# Patient Record
Sex: Male | Born: 1990 | Race: White | Hispanic: No | Marital: Single | Smoking: Current every day smoker
Health system: Southern US, Community
[De-identification: ages and names within clinical notes are randomized; demographics above are authoritative.]

---
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
## 2012-11-24 ENCOUNTER — Encounter (HOSPITAL_COMMUNITY): Payer: Self-pay | Admitting: Emergency Medicine

## 2012-11-24 ENCOUNTER — Emergency Department (HOSPITAL_COMMUNITY)
Admission: EM | Admit: 2012-11-24 | Discharge: 2012-11-25 | Disposition: A | Discharge status: Home / Self Care | Payer: Managed Care, Other (non HMO) | Attending: Emergency Medicine | Admitting: Emergency Medicine

## 2012-11-24 ENCOUNTER — Emergency Department (HOSPITAL_COMMUNITY): Payer: Managed Care, Other (non HMO)

## 2012-11-24 DIAGNOSIS — Y9239 Other specified sports and athletic area as the place of occurrence of the external cause: Secondary | ICD-10-CM | POA: Insufficient documentation

## 2012-11-24 DIAGNOSIS — J45909 Unspecified asthma, uncomplicated: Secondary | ICD-10-CM | POA: Insufficient documentation

## 2012-11-24 DIAGNOSIS — F17200 Nicotine dependence, unspecified, uncomplicated: Secondary | ICD-10-CM | POA: Insufficient documentation

## 2012-11-24 DIAGNOSIS — S61409A Unspecified open wound of unspecified hand, initial encounter: Secondary | ICD-10-CM | POA: Insufficient documentation

## 2012-11-24 DIAGNOSIS — S61412A Laceration without foreign body of left hand, initial encounter: Secondary | ICD-10-CM

## 2012-11-24 DIAGNOSIS — Z23 Encounter for immunization: Secondary | ICD-10-CM | POA: Insufficient documentation

## 2012-11-24 DIAGNOSIS — Y9351 Activity, roller skating (inline) and skateboarding: Secondary | ICD-10-CM | POA: Insufficient documentation

## 2012-11-24 DIAGNOSIS — W268XXA Contact with other sharp object(s), not elsewhere classified, initial encounter: Secondary | ICD-10-CM | POA: Insufficient documentation

## 2012-11-24 MED ORDER — TETANUS-DIPHTH-ACELL PERTUSSIS 5-2.5-18.5 LF-MCG/0.5 IM SUSP
0.5000 mL | Freq: Once | INTRAMUSCULAR | Status: AC
  Administered 2012-11-24: 0.5 mL via INTRAMUSCULAR
  Filled 2012-11-24: qty 0.5

## 2012-11-24 NOTE — ED Provider Notes (Signed)
CSN: [id number]     Arrival date & time 11/24/12  2042 History  This chart was scribed for Felicie Morn, NP, working with Juliet Rude. Rubin Payor, MD, by Allene Dillon, ED Scribe. This patient was seen in room TR06C/TR06C and the patient's care was started at 9:24 PM.   Chief Complaint  Patient presents with  . Laceration    Patient is a 22 y.o. male presenting with skin laceration. The history is provided by the patient. No language interpreter was used.  Laceration Location:  Hand Hand laceration location:  L hand Length (cm):  2 Depth:  Cutaneous Bleeding: controlled   Time since incident:  2 hours Laceration mechanism:  Unable to specify Relieved by:  None tried Worsened by:  Movement Ineffective treatments:  None tried Tetanus status:  Unknown  HPI Comments: Sergio Green is a 22 y.o. male who presents to the Emergency Department complaining of a laceration to his left hand onset earlier today. Bleeding is controlled. Pt states that he was skateboarding, and "somehow injured his hand". He denies weakness, numbness or tingling to the hand. He states that he is unsure if his Tetanus vaccinations may not be up to date. He denies fever, chills, nausea, emesis or any other symptoms.   Past Medical History  Diagnosis Date  . Asthma    History reviewed. No pertinent past surgical history. No family history on file. History  Substance Use Topics  . Smoking status: Current Every Day Smoker -- 0.30 packs/day for 1 years    Types: Cigarettes  . Smokeless tobacco: Never Used  . Alcohol Use: No    Review of Systems  Constitutional: Negative for fever and chills.  Gastrointestinal: Negative for nausea and vomiting.  Skin: Positive for wound (left hand laceration).  All other systems reviewed and are negative.   Allergies  Shellfish allergy  Home Medications   Current Outpatient Rx  Name  Route  Sig  Dispense  Refill  . acetaminophen (TYLENOL) 500 MG tablet   Oral  Take 1,000 mg by mouth every 6 (six) hours as needed for pain.          Triage Vitals: BP 130/80  Pulse 92  Temp(Src) 98.7 F (37.1 C) (Oral)  Resp 16  SpO2 98%  Physical Exam  Nursing note and vitals reviewed. Constitutional: He is oriented to person, place, and time. He appears well-developed and well-nourished.  HENT:  Head: Normocephalic.  Eyes: EOM are normal.  Neck: Normal range of motion.  Cardiovascular: Normal rate and regular rhythm.   Pulmonary/Chest: Effort normal and breath sounds normal. No respiratory distress.  Abdominal: Soft. He exhibits no distension.  Musculoskeletal: Normal range of motion.  Neurological: He is alert and oriented to person, place, and time.  Skin: Skin is warm and dry.  Laceration overlying the MP joints of the 4th and 5th fingers of the left hand. Full ROM of wrist, hand and all fingers.  Psychiatric: He has a normal mood and affect.    ED Course  Procedures (including critical care time)  LACERATION REPAIR Performed by: Jimmye Norman Authorized by: Jimmye Norman Consent: Verbal consent obtained. Risks and benefits: risks, benefits and alternatives were discussed Consent given by: patient Patient identity confirmed: provided demographic data Prepped and Draped in normal sterile fashion Wound explored  Laceration Location: MP joint, little finger, left hand  Laceration Length: 2 cm  No Foreign Bodies seen or palpated  Anesthesia: local infiltration  Local anesthetic: lidocaine 2%  Anesthetic total: 3 ml  Irrigation method: syringe  Amount of cleaning: extensive  Skin closure: 4.0 prolene  Number of sutures: 4  Technique: simple interrupted  Patient tolerance: Patient tolerated the procedure well with no immediate complications.  DIAGNOSTIC STUDIES: Oxygen Saturation is 98% on RA, normal by my interpretation.    COORDINATION OF CARE: 9:28 PM- Pt advised of plan for laceration repair and pt agrees.  Labs  Review Labs Reviewed - No data to display Imaging Review No results found.  MDM  Laceration to MP joint left little finger.  Radiology findings concerning for occult fracture proximal phalanx.  Unable to contact hand surgeon via answering service after multiple attempts--discussed with Dr. Jerene Canny suture and cover with antibiotic, and have patient contact the office in the morning to schedule follow-up.  Return precautions discussed.   I personally performed the services described in this documentation, which was scribed in my presence. The recorded information has been reviewed and is accurate.    Jimmye Norman, NP 11/25/12 [phone]

## 2012-11-24 NOTE — ED Notes (Signed)
Pt presented to ED with left hand laceration.As per pt he was in skating board and as he bent over he felt a different something and  saw the bleed from his left hand.Pt not sure how it happened.

## 2012-11-25 MED ORDER — CEPHALEXIN 500 MG PO CAPS: 500.0000 mg | ORAL_CAPSULE | Freq: Four times a day (QID) | ORAL | Status: AC

## 2012-11-25 NOTE — ED Notes (Signed)
Pts mother complained that the floor was dirty .Floor was cleaned by house keeping before pt was brought to the room.Pt and his mother was reassured that all was clean and the floor has been cleaned.

## 2012-11-25 NOTE — ED Notes (Signed)
Pt discharged.Vital signs stable and GCS 15 

## 2012-11-26 NOTE — ED Provider Notes (Signed)
Medical screening examination/treatment/procedure(s) were performed by non-physician practitioner and as supervising physician I was immediately available for consultation/collaboration.  Keely Drennan R. Mikale Silversmith, MD 11/26/12 1612 

## 2012-12-01 ENCOUNTER — Inpatient Hospital Stay: Payer: Self-pay | Admitting: Surgery

## 2012-12-01 LAB — URINALYSIS, COMPLETE
Bacteria: NONE SEEN
Bilirubin,UR: NEGATIVE
Blood: NEGATIVE
Glucose,UR: NEGATIVE mg/dL (ref 0–75)
Leukocyte Esterase: NEGATIVE
Ph: 6 (ref 4.5–8.0)
RBC,UR: NONE SEEN /HPF (ref 0–5)
Specific Gravity: 1.018 (ref 1.003–1.030)
Squamous Epithelial: NONE SEEN
WBC UR: 1 /HPF (ref 0–5)

## 2012-12-01 LAB — COMPREHENSIVE METABOLIC PANEL
Albumin: 4.1 g/dL (ref 3.4–5.0)
BUN: 13 mg/dL (ref 7–18)
Bilirubin,Total: 1.2 mg/dL — ABNORMAL HIGH (ref 0.2–1.0)
Calcium, Total: 9 mg/dL (ref 8.5–10.1)
Chloride: 99 mmol/L (ref 98–107)
Co2: 30 mmol/L (ref 21–32)
EGFR (Non-African Amer.): >60
Glucose: 117 mg/dL — ABNORMAL HIGH (ref 65–99)
Osmolality: 268 (ref 275–301)
Potassium: 4.2 mmol/L (ref 3.5–5.1)
SGOT(AST): 25 U/L (ref 15–37)
Total Protein: 7.6 g/dL (ref 6.4–8.2)

## 2012-12-01 LAB — CBC
HCT: 39.8 % — ABNORMAL LOW (ref 40.0–52.0)
MCV: 90 fL (ref 80–100)
RBC: 4.43 10*6/uL (ref 4.40–5.90)
RDW: 12.7 % (ref 11.5–14.5)
WBC: 12.5 10*3/uL — ABNORMAL HIGH (ref 3.8–10.6)

## 2012-12-01 LAB — LIPASE, BLOOD: Lipase: 45 U/L — ABNORMAL LOW (ref 73–393)

## 2012-12-02 LAB — CBC WITH DIFFERENTIAL/PLATELET
Basophil #: 0 10*3/uL (ref 0.0–0.1)
Basophil %: 0.6 %
Eosinophil #: 0.1 10*3/uL (ref 0.0–0.7)
Eosinophil #: 0.1 10*3/uL (ref 0.0–0.7)
Eosinophil %: 1.9 %
Eosinophil %: 1.7 %
HCT: 34.8 % — ABNORMAL LOW (ref 40.0–52.0)
HCT: 34.1 % — ABNORMAL LOW (ref 40.0–52.0)
HGB: 12.4 g/dL — ABNORMAL LOW (ref 13.0–18.0)
HGB: 12.1 g/dL — ABNORMAL LOW (ref 13.0–18.0)
Lymphocyte #: 1.7 10*3/uL (ref 1.0–3.6)
Lymphocyte #: 1.5 10*3/uL (ref 1.0–3.6)
Lymphocyte %: 23.8 %
Lymphocyte %: 25.5 %
MCH: 31.7 pg (ref 26.0–34.0)
MCHC: 35.6 g/dL (ref 32.0–36.0)
MCHC: 35.5 g/dL (ref 32.0–36.0)
MCV: 90 fL (ref 80–100)
Monocyte #: 0.8 x10 3/mm (ref 0.2–1.0)
Monocyte #: 0.7 x10 3/mm (ref 0.2–1.0)
Monocyte %: 11.4 %
Neutrophil #: 4.5 10*3/uL (ref 1.4–6.5)
Neutrophil %: 63.2 %
RDW: 12.4 % (ref 11.5–14.5)
WBC: 6.1 10*3/uL (ref 3.8–10.6)

## 2012-12-02 LAB — AMYLASE: Amylase: 21 U/L — ABNORMAL LOW (ref 25–115)

## 2012-12-02 LAB — COMPREHENSIVE METABOLIC PANEL
Albumin: 3.6 g/dL (ref 3.4–5.0)
Alkaline Phosphatase: 80 U/L (ref 50–136)
BUN: 11 mg/dL (ref 7–18)
Chloride: 102 mmol/L (ref 98–107)
Glucose: 97 mg/dL (ref 65–99)
Potassium: 3.9 mmol/L (ref 3.5–5.1)
SGOT(AST): 23 U/L (ref 15–37)
Total Protein: 6.8 g/dL (ref 6.4–8.2)

## 2012-12-02 LAB — PROTIME-INR: INR: 1.1

## 2012-12-02 LAB — LIPASE, BLOOD: Lipase: 43 U/L — ABNORMAL LOW (ref 73–393)

## 2012-12-03 LAB — COMPREHENSIVE METABOLIC PANEL
Alkaline Phosphatase: 76 U/L (ref 50–136)
BUN: 6 mg/dL — ABNORMAL LOW (ref 7–18)
Co2: 28 mmol/L (ref 21–32)
Glucose: 96 mg/dL (ref 65–99)
Osmolality: 273 (ref 275–301)
Sodium: 138 mmol/L (ref 136–145)
Total Protein: 6.4 g/dL (ref 6.4–8.2)

## 2012-12-03 LAB — CBC WITH DIFFERENTIAL/PLATELET
Basophil #: 0 10*3/uL (ref 0.0–0.1)
Basophil %: 0.7 %
Eosinophil %: 3.4 %
HGB: 11.8 g/dL — ABNORMAL LOW (ref 13.0–18.0)
Lymphocyte %: 26.5 %
MCH: 32 pg (ref 26.0–34.0)
MCHC: 35.5 g/dL (ref 32.0–36.0)
MCV: 90 fL (ref 80–100)
Monocyte %: 12.8 %
Neutrophil #: 3 10*3/uL (ref 1.4–6.5)
Neutrophil %: 56.6 %
RBC: 3.67 10*6/uL — ABNORMAL LOW (ref 4.40–5.90)

## 2012-12-07 ENCOUNTER — Ambulatory Visit: Payer: Self-pay | Admitting: Surgery

## 2012-12-07 LAB — CBC WITH DIFFERENTIAL/PLATELET
Basophil #: 0 10*3/uL (ref 0.0–0.1)
Basophil %: 0.4 %
Eosinophil #: 0.3 10*3/uL (ref 0.0–0.7)
Eosinophil %: 3.9 %
HGB: 14 g/dL (ref 13.0–18.0)
Monocyte %: 12.1 %
Platelet: 242 10*3/uL (ref 150–440)

## 2013-01-05 ENCOUNTER — Ambulatory Visit: Payer: Self-pay | Admitting: Surgery

## 2014-03-30 IMAGING — CT CT ABD-PELV W/ CM
1 of 3 series · 15 of 32 positions shown, 19 images · IV contrast (isovue)
Comparison: 12/01/2012

CLINICAL DATA: Status post fall. Ruptured spleen.

EXAM:
CT ABDOMEN AND PELVIS WITH CONTRAST
TECHNIQUE: Multidetector CT imaging of the abdomen and pelvis was performed
using the standard protocol following bolus administration of
intravenous contrast.
CONTRAST:  100 cc of Isovue 300

[Series 3: routine abd pel thins · axial · 0.66mm/px · z∈[-939,-546]mm · 15 of 359 slices shown, 19 images]
[im 16/359  soft-tissue]
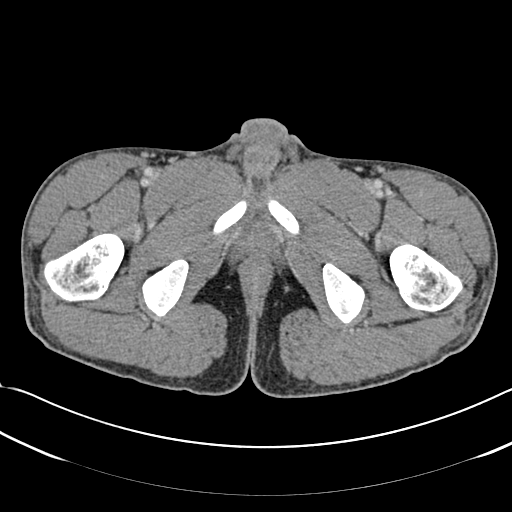
[im 16/359  bone]
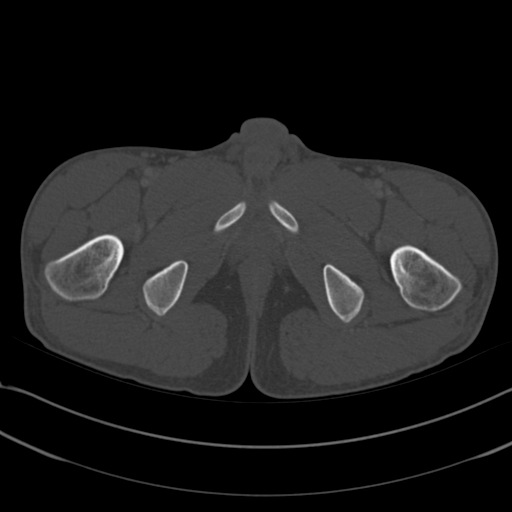
[im 47/359  soft-tissue]
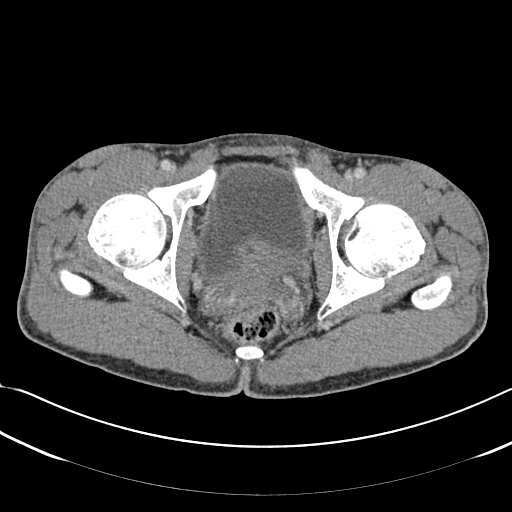
[im 78/359  soft-tissue]
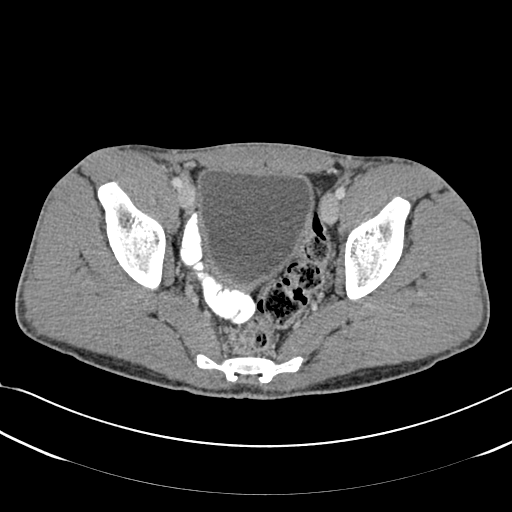
[im 94/359  soft-tissue]
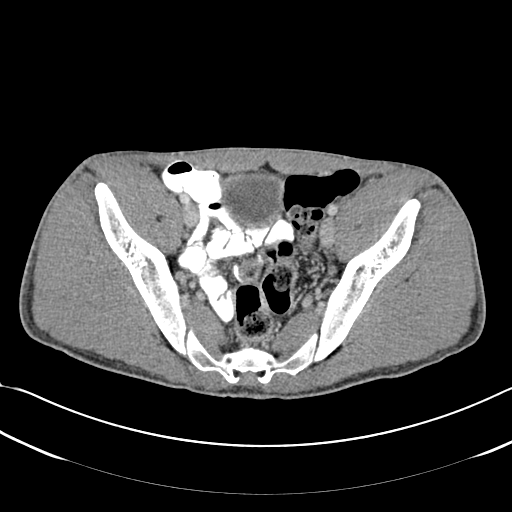
[im 125/359  soft-tissue]
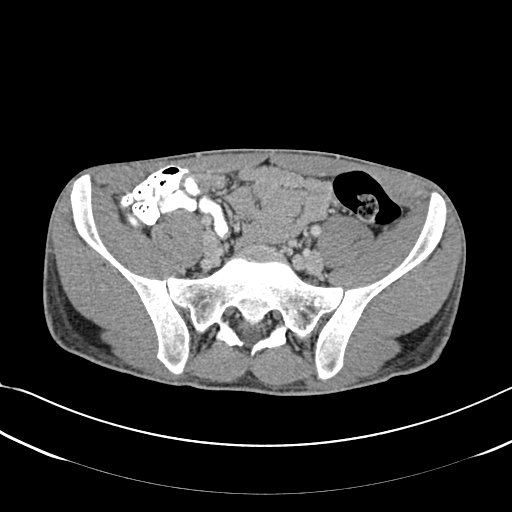
[im 156/359  soft-tissue]
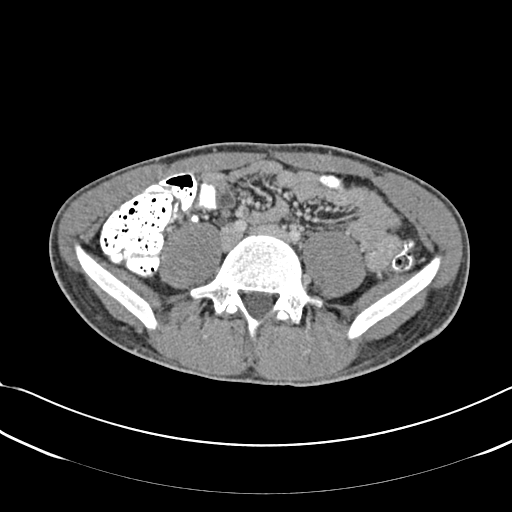
[im 187/359  soft-tissue]
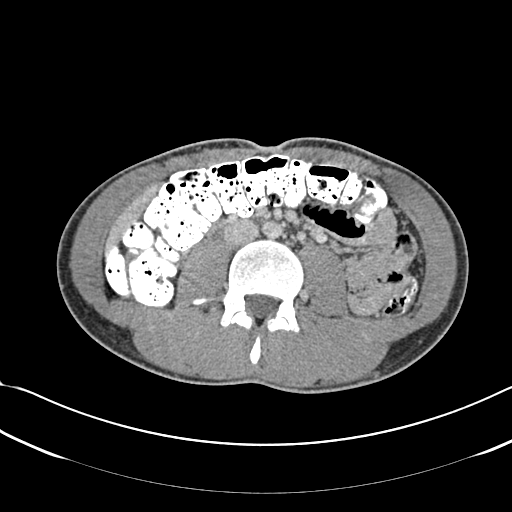
[im 203/359  soft-tissue]
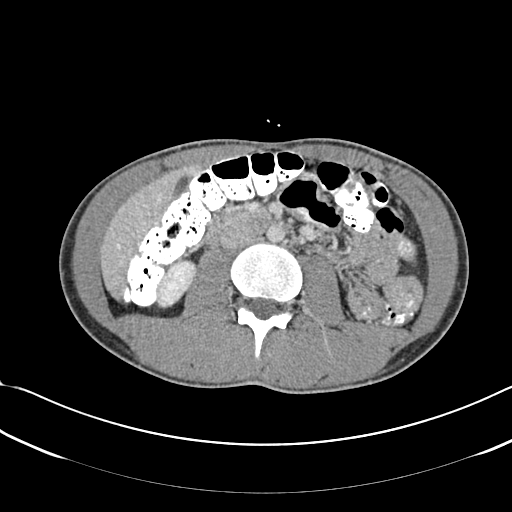
[im 234/359  soft-tissue]
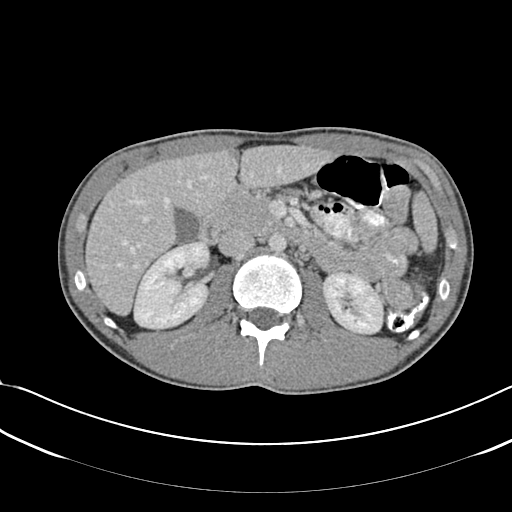
[im 234/359  bone]
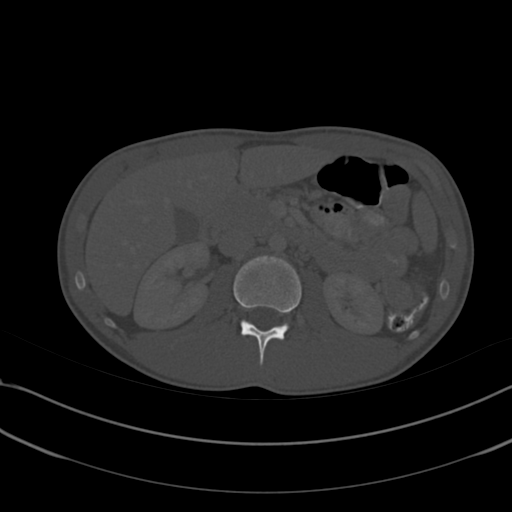
[im 265/359  soft-tissue]
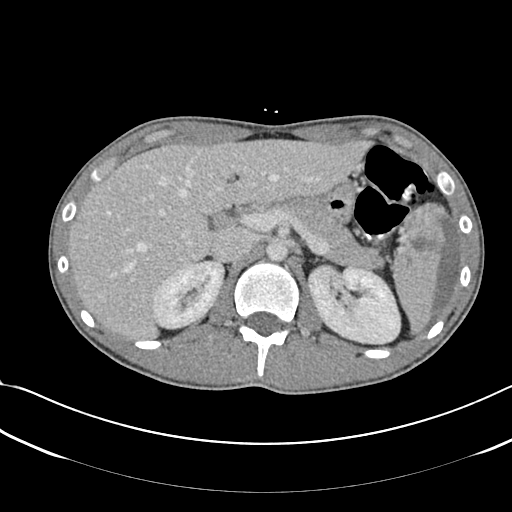
[im 281/359  soft-tissue]
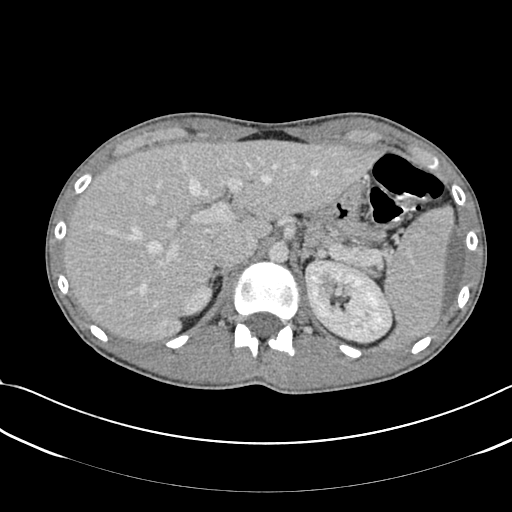
[im 296/359  lung]
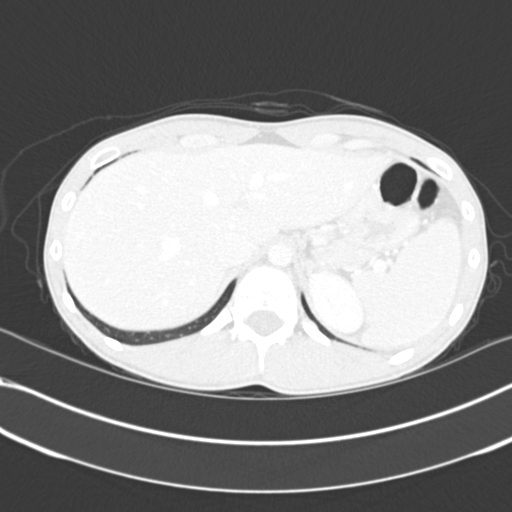
[im 312/359  soft-tissue]
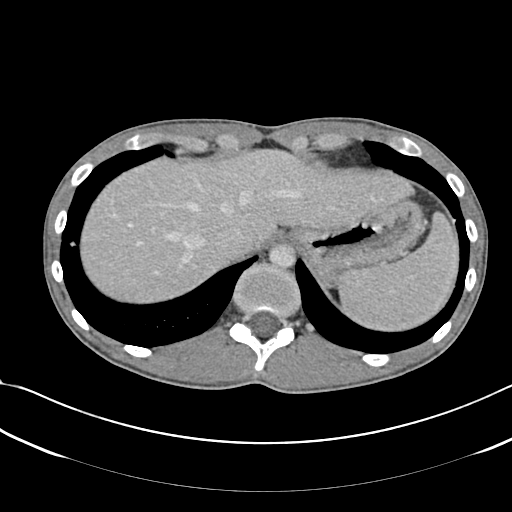
[im 312/359  lung]
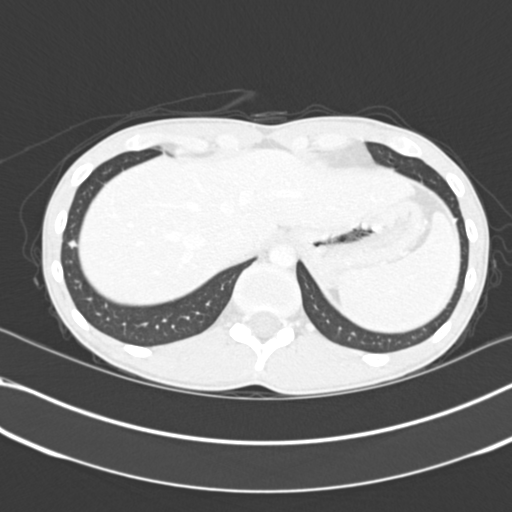
[im 327/359  lung]
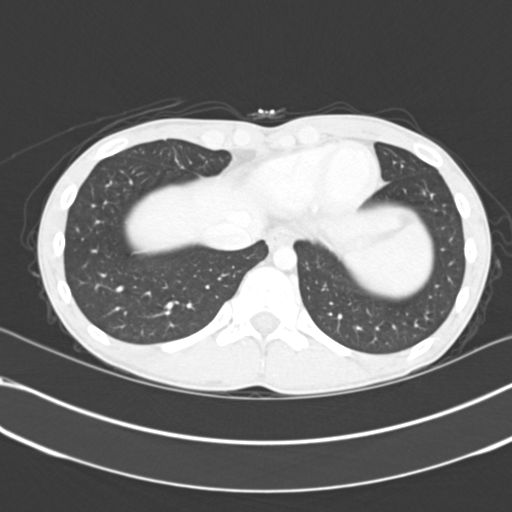
[im 343/359  soft-tissue]
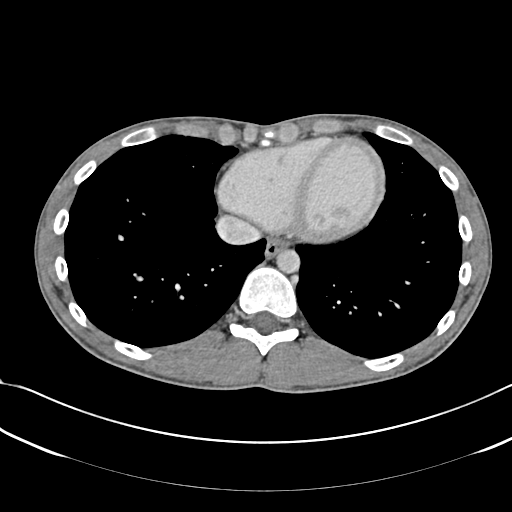
[im 343/359  lung]
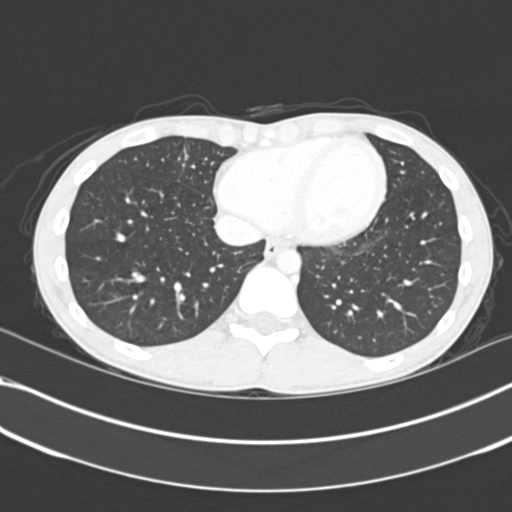

[15 of 32 positions shown; findings below may reference images not displayed]

FINDINGS: The lung bases are clear. No pericardial or pleural effusion
identified no focal liver abnormality identified. The gallbladder
appears within normal limits. No biliary dilatation. Normal
appearance of the pancreas. Laceration involving the inferior aspect
of the spleen is again noted. The laceration measures approximately
2 x 2 cm which is stable to decreased in size from previous exam.
Small low attenuation subcapsular fluid collection is decreased in
volume from previous exam compatible with resolving hematoma.

The adrenal glands are normal. The kidneys are both on unremarkable.
The urinary bladder is negative. There is a trace amount of
low-attenuation fluid within the dependent portion of the pelvis.

Normal caliber of the abdominal aorta. There is no aneurysm. No
upper abdominal adenopathy identified. There is no pelvic or
inguinal adenopathy.

The stomach appears normal. The small bowel loops have a normal
course and caliber and there is no obstruction. The appendix is
visualized and appears normal. Normal appearance of the colon.

Review of the visualized bony structures shows no acute bone
abnormality.
IMPRESSION: 1. Stable and improved appearance of laceration involving the
inferior aspect of the spleen.

2. Improved appearance of subcapsular splenic fluid collection
compatible with resolving hematoma

3. Decrease in hemoperitoneum with only a trace amount of
low-attenuation fluid within the dependent portion of the pelvis.

## 2014-06-24 NOTE — H&P (Signed)
History of Present Illness 22 yowm who incurred a skateboarding injury one week ago, requiring suture repair of a left hand laceration. No LOC or pain other than his hand then. Did well until last night at approximately 500 PM when he experienced stomach pain while visiting a friend. He felt hungry, was able to drive home, and then late last evening he became dizzy. He was able to sleep OK last night and then after a nap this AM he sought medical attention. He has not had any further dizziness. He denies fever, chills, or any other Sx.   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   + Tobacco Current (within 1 year)   Place of [place]   Review of Systems:  Fever/Chills No   Cough No   Sputum No   Abdominal Pain Yes   Diarrhea No   Constipation No   Nausea/Vomiting No   SOB/DOE No   Chest Pain No   Dysuria No   Tolerating PT Yes   Tolerating Diet Yes   Medications/Allergies Reviewed Medications/Allergies reviewed   Physical Exam:  GEN well developed, well nourished, no acute distress, thin   HEENT pink conjunctivae, PERRL, hearing intact to voice, moist oral mucosa, Oropharynx clear, good dentition   NECK supple  No masses  trachea midline   RESP normal resp effort  clear BS  no use of accessory muscles   CARD regular rate  no murmur  no thrills  no JVD  no Rub   ABD denies tenderness  denies Flank Tenderness  soft  normal BS  scaphoid and completely nontender   LYMPH negative neck   EXTR negative cyanosis/clubbing, negative edema   SKIN normal to palpation, skin turgor good   NEURO cranial nerves intact, negative tremor, follows commands, motor/sensory function intact   PSYCH alert, A+O to time, place, person, good insight   Lab Results:   Hepatic:  30-Sep-14 15:40   Bilirubin, Total  1.2  Alkaline Phosphatase 89  SGPT (ALT) 22  SGOT (AST) 25  Total Protein, Serum 7.6  Albumin, Serum 4.1  Routine Chem:  30-Sep-14 15:40   Glucose, Serum  117  BUN 13  Creatinine (comp) 0.97  Sodium, Serum  133  Potassium, Serum 4.2  Chloride, Serum 99  CO2, Serum 30  Calcium (Total), Serum 9.0  Osmolality (calc) 268  eGFR (African American) >60  eGFR (Non-African American) >60 (eGFR values <31m/min/1.73 m2 may be an indication of chronic kidney disease (CKD). Calculated eGFR is useful in patients with stable renal function. The eGFR calculation will not be reliable in acutely ill patients when serum creatinine is changing rapidly. It is not useful in  patients on dialysis. The eGFR calculation may not be applicable to patients at the low and high extremes of body sizes, pregnant women, and vegetarians.)  Anion Gap  4  Lipase  45 (Result(s) reported on 01 Dec 2012 at 04:17PM.)  Routine UA:  30-Sep-14 15:40   Color (UA) Yellow  Clarity (UA) Clear  Glucose (UA) Negative  Bilirubin (UA) Negative  Ketones (UA) 1+  Specific Gravity (UA) 1.018  Blood (UA) Negative  pH (UA) 6.0  Protein (UA) Negative  Nitrite (UA) Negative  Leukocyte Esterase (UA) Negative (Result(s) reported on 01 Dec 2012 at 04:01PM.)  RBC (UA) NONE  SEEN  WBC (UA) 1 /HPF  Bacteria (UA) NONE SEEN  Epithelial Cells (UA) NONE SEEN  Mucous (UA) PRESENT (Result(s) reported on 01 Dec 2012 at 04:01PM.)  Routine Hem:  30-Sep-14 15:40   WBC (CBC)  12.5  RBC (CBC) 4.43  Hemoglobin (CBC) 13.9  Hematocrit (CBC)  39.8  Platelet Count (CBC) 175 (Result(s) reported on 01 Dec 2012 at 03:59PM.)  MCV 90  MCH 31.4  MCHC 35.0  RDW 12.7   Radiology Results: LabUnknown:    30-Sep-14 21:39, CT Abdomen and Pelvis Without Contrast  PACS Image  CT:  CT Abdomen and Pelvis Without Contrast  REASON FOR EXAM:    (1) lower abd pain, hi white count, po contrast   onlyl;  (2) lower abd pain, hhi w  COMMENTS:       PROCEDURE: CT  - CT ABDOMEN AND PELVIS W0  - Dec 01 2012  9:39PM     RESULT: Axial CT scanning was performed through the abdomen and pelvis   following administration of oral contrast only. Review of multiplanar   reconstructed images was performed separately on the VIA monitor.    The orally administered contrast has reached as far distally as the   rectum. There is no evidenceof a small or large bowel obstruction. The   appendix normal in caliber and partially contrast-filled and demonstrated   on images 63-72. There is no evidence of enteritis or colitis.   The liver is grossly normal in density and contour. There is a small   amount of high density fluid adjacent to the convexity of the liver.   There is abnormal appearance of the spleen with a heterogeneous density   that is suspicious for subacute subcapsular hemorrhage and laceration.   There is fluid adjacent tothe pancreas and in the paracolic gutters and   in the pelvis that is high density with Hounsfield measurement a +38   worrisome for acuteblood.     The pancreas is poorly demonstrated but no definite focal abnormality is   demonstrated. The kidneys and adrenal glands are grossly normal. The   caliber of the abdominal aorta is normal. The psoas musculature is normal   in appearance. There is free fluid in the pelvis. The urinary bladder is   partially distended and there is subjective bladder wall thickening.    The lung bases are clear. The observed portions of the lower ribs exhibit     no evidence of acute fractures. The lumbar vertebral bodies are preserved   in height. The transverse processes appear intact. The bony pelvis is   normal in appearance.    IMPRESSION:   1. There is an abnormal appearance of the spleen which is worrisome for   spontaneous or posttraumatic rupture. There is a small amount of high   density fluid fluid surrounding the convexity of the liver  but no   evidence of acute abnormality of the liver. There is high density fluid   in the paracolic gutters especially on the left and a larger amount in   the pelvis.   2. There is no acute urinary tract abnormality nor acute bowel   abnormality.    A preliminary report report was given by me by telephone to Werner Lean, RN,     in the emergency department at 9:48 p.m. on December 01, 2012.     Dictation Site: 5        Verified By: DAVID A. 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   Plan I have discussed all ramifications of both treatment options with him (immediate splenectomy vs admission with serial Hcts, very light activity for > 6 weeks with multiple repeat CT scans), including the miniscule risk of postsplenectomy sepsis, need for anti-platelet agents, and maintaining a current Rx for PCN, etc. and he plans to confer with his mother and decide.   Electronic Signatures: Consuela Mimes (MD)  (Signed 30-Sep-14 22:53)  Authored: CHIEF COMPLAINT and HISTORY, HOME MEDICATIONS, FAMILY AND SOCIAL HISTORY, REVIEW OF SYSTEMS, PHYSICAL EXAM, LABS, Radiology, ASSESSMENT AND PLAN   Last Updated: 30-Sep-14 22:53 by Consuela Mimes (MD)
